# Patient Record
Sex: Female | Born: 1935 | Race: White | Hispanic: No | Marital: Married | Smoking: Never smoker
Health system: Southern US, Community
[De-identification: ages and names within clinical notes are randomized; demographics above are authoritative.]

## PROBLEM LIST (undated history)

## (undated) DIAGNOSIS — E7800 Pure hypercholesterolemia, unspecified: Secondary | ICD-10-CM

## (undated) DIAGNOSIS — I10 Essential (primary) hypertension: Secondary | ICD-10-CM

## (undated) HISTORY — PX: PARATHYROIDECTOMY: SHX19

## (undated) HISTORY — PX: APPENDECTOMY: SHX54

## (undated) HISTORY — PX: ABDOMINAL HYSTERECTOMY: SHX81

---

## 2015-06-13 ENCOUNTER — Emergency Department (HOSPITAL_BASED_OUTPATIENT_CLINIC_OR_DEPARTMENT_OTHER): Payer: Medicare HMO

## 2015-06-13 ENCOUNTER — Encounter (HOSPITAL_BASED_OUTPATIENT_CLINIC_OR_DEPARTMENT_OTHER): Payer: Self-pay

## 2015-06-13 ENCOUNTER — Emergency Department (HOSPITAL_BASED_OUTPATIENT_CLINIC_OR_DEPARTMENT_OTHER)
Admission: EM | Admit: 2015-06-13 | Discharge: 2015-06-13 | Disposition: A | Discharge status: Other Acute Inpatient Hospital | Payer: Medicare HMO | Attending: Emergency Medicine | Admitting: Emergency Medicine

## 2015-06-13 DIAGNOSIS — R05 Cough: Secondary | ICD-10-CM | POA: Diagnosis present

## 2015-06-13 DIAGNOSIS — E7800 Pure hypercholesterolemia, unspecified: Secondary | ICD-10-CM | POA: Insufficient documentation

## 2015-06-13 DIAGNOSIS — Z79899 Other long term (current) drug therapy: Secondary | ICD-10-CM | POA: Diagnosis not present

## 2015-06-13 DIAGNOSIS — I10 Essential (primary) hypertension: Secondary | ICD-10-CM | POA: Insufficient documentation

## 2015-06-13 DIAGNOSIS — J189 Pneumonia, unspecified organism: Secondary | ICD-10-CM | POA: Diagnosis not present

## 2015-06-13 HISTORY — DX: Essential (primary) hypertension: I10

## 2015-06-13 HISTORY — DX: Pure hypercholesterolemia, unspecified: E78.00

## 2015-06-13 LAB — URINALYSIS, ROUTINE W REFLEX MICROSCOPIC
BILIRUBIN URINE: NEGATIVE
GLUCOSE, UA: NEGATIVE mg/dL
HGB URINE DIPSTICK: NEGATIVE
KETONES UR: 15 mg/dL — AB
NITRITE: NEGATIVE
PH: 8 (ref 5.0–8.0)
Protein, ur: 30 mg/dL — AB
Specific Gravity, Urine: 1.019 (ref 1.005–1.030)

## 2015-06-13 LAB — CBC WITH DIFFERENTIAL/PLATELET
BASOS ABS: 0 10*3/uL (ref 0.0–0.1)
Basophils Relative: 0 %
Eosinophils Absolute: 0.1 10*3/uL (ref 0.0–0.7)
Eosinophils Relative: 1 %
HEMATOCRIT: 41.5 % (ref 36.0–46.0)
Hemoglobin: 13.6 g/dL (ref 12.0–15.0)
LYMPHS PCT: 9 %
Lymphs Abs: 1.7 10*3/uL (ref 0.7–4.0)
MCH: 28.7 pg (ref 26.0–34.0)
MCHC: 32.8 g/dL (ref 30.0–36.0)
MCV: 87.6 fL (ref 78.0–100.0)
MONO ABS: 0.9 10*3/uL (ref 0.1–1.0)
Monocytes Relative: 5 %
NEUTROS ABS: 15.8 10*3/uL — AB (ref 1.7–7.7)
Neutrophils Relative %: 85 %
PLATELETS: 253 10*3/uL (ref 150–400)
RBC: 4.74 MIL/uL (ref 3.87–5.11)
RDW: 14.5 % (ref 11.5–15.5)
WBC: 18.5 10*3/uL — AB (ref 4.0–10.5)

## 2015-06-13 LAB — COMPREHENSIVE METABOLIC PANEL
ALBUMIN: 4.4 g/dL (ref 3.5–5.0)
ALT: 13 U/L — ABNORMAL LOW (ref 14–54)
ANION GAP: 10 (ref 5–15)
AST: 19 U/L (ref 15–41)
Alkaline Phosphatase: 58 U/L (ref 38–126)
BILIRUBIN TOTAL: 0.6 mg/dL (ref 0.3–1.2)
BUN: 13 mg/dL (ref 6–20)
CHLORIDE: 104 mmol/L (ref 101–111)
CO2: 24 mmol/L (ref 22–32)
Calcium: 9.1 mg/dL (ref 8.9–10.3)
Creatinine, Ser: 0.79 mg/dL (ref 0.44–1.00)
GFR calc Af Amer: >60 mL/min (ref >60)
GFR calc non Af Amer: >60 mL/min (ref >60)
GLUCOSE: 136 mg/dL — AB (ref 65–99)
POTASSIUM: 3.4 mmol/L — AB (ref 3.5–5.1)
Sodium: 138 mmol/L (ref 135–145)
TOTAL PROTEIN: 8 g/dL (ref 6.5–8.1)

## 2015-06-13 LAB — URINE MICROSCOPIC-ADD ON

## 2015-06-13 LAB — I-STAT CG4 LACTIC ACID, ED: Lactic Acid, Venous: 1.28 mmol/L (ref 0.5–2.0)

## 2015-06-13 MED ORDER — SODIUM CHLORIDE 0.9 % IV BOLUS (SEPSIS)
1000.0000 mL | Freq: Once | INTRAVENOUS | Status: AC
  Administered 2015-06-13: 1000 mL via INTRAVENOUS

## 2015-06-13 MED ORDER — DEXTROSE 5 % IV SOLN
1.0000 g | Freq: Once | INTRAVENOUS | Status: AC
  Administered 2015-06-13: 1 g via INTRAVENOUS
  Filled 2015-06-13: qty 10

## 2015-06-13 MED ORDER — IOPAMIDOL (ISOVUE-300) INJECTION 61%
75.0000 mL | Freq: Once | INTRAVENOUS | Status: AC | PRN
  Administered 2015-06-13: 75 mL via INTRAVENOUS

## 2015-06-13 MED ORDER — ACETAMINOPHEN 325 MG PO TABS
650.0000 mg | ORAL_TABLET | Freq: Once | ORAL | Status: AC
  Administered 2015-06-13: 650 mg via ORAL
  Filled 2015-06-13: qty 2

## 2015-06-13 MED ORDER — AZITHROMYCIN 250 MG PO TABS
500.0000 mg | ORAL_TABLET | Freq: Once | ORAL | Status: AC
  Administered 2015-06-13: 500 mg via ORAL
  Filled 2015-06-13: qty 2

## 2015-06-13 NOTE — ED Provider Notes (Signed)
CSN: [id number]     Arrival date & time 06/13/15  0820 History   First MD Initiated Contact with Patient 06/13/15 [phone]     Chief Complaint  Patient presents with  . Cough    Patient is a 82 y.o. female presenting with cough. The history is provided by the patient. No language interpreter was used.  Cough  Melanie Colon is a 82 y.o. female who presents to the Emergency Department complaining of sore throat and cough.  She was at the beach yesterday with her husband when she began feeling poorly. She awoke with a sore throat, nausea, body aches, cough. Overall her sore throat has improved but she continues to feel poorly with nausea, cough, body aches. She is coughing up yellow and green material. No known sick contacts. No abdominal pain, chest pain, dysuria. Symptoms are moderate and constant in nature.  Past Medical History  Diagnosis Date  . Hypertension   . Hypercholesteremia    Past Surgical History  Procedure Laterality Date  . Abdominal hysterectomy    . Parathyroidectomy    . Appendectomy     No family history on file. Social History  Substance Use Topics  . Smoking status: Never Smoker   . Smokeless tobacco: None  . Alcohol Use: No   OB History    No data available     Review of Systems  Respiratory: Positive for cough.   All other systems reviewed and are negative.     Allergies  Codeine  Home Medications   Prior to Admission medications   Medication Sig Start Date End Date Taking? Authorizing Provider  amLODipine (NORVASC) 10 MG tablet Take 10 mg by mouth daily.   Yes Historical Provider, MD  pravastatin (PRAVACHOL) 20 MG tablet Take 20 mg by mouth daily.   Yes Historical Provider, MD   BP 170/85 mmHg  Pulse 93  Temp(Src) 97.8 F (36.6 C) (Oral)  Resp 22  Ht 5\' 5"  (1.651 m)  Wt 155 lb (70.308 kg)  BMI 25.79 kg/m2  SpO2 94% Physical Exam  Constitutional: She is oriented to person, place, and time. She appears well-developed and well-nourished.   HENT:  Head: Normocephalic and atraumatic.  TMs clear bilaterally. There is mild erythema in the posterior oropharynx without any significant edema. No tonsillar exudate  Cardiovascular: Normal rate and regular rhythm.   No murmur heard. Pulmonary/Chest: Effort normal and breath sounds normal. No stridor. No respiratory distress.  Abdominal: Soft. There is no tenderness. There is no rebound and no guarding.  Musculoskeletal: She exhibits no edema or tenderness.  Neurological: She is alert and oriented to person, place, and time.  Skin: Skin is warm and dry.  Psychiatric: She has a normal mood and affect. Her behavior is normal.  Nursing note and vitals reviewed.   ED Course  Procedures (including critical care time) Labs Review Labs Reviewed  COMPREHENSIVE METABOLIC PANEL - Abnormal; Notable for the following:    Potassium 3.4 (*)    Glucose, Bld 136 (*)    ALT 13 (*)    All other components within normal limits  CBC WITH DIFFERENTIAL/PLATELET - Abnormal; Notable for the following:    WBC 18.5 (*)    Neutro Abs 15.8 (*)    All other components within normal limits  URINALYSIS, ROUTINE W REFLEX MICROSCOPIC (NOT AT [facility]) - Abnormal; Notable for the following:    APPearance CLOUDY (*)    Ketones, ur 15 (*)    Protein, ur 30 (*)  Leukocytes, UA MODERATE (*)    All other components within normal limits  URINE MICROSCOPIC-ADD ON - Abnormal; Notable for the following:    Squamous Epithelial / LPF 0-5 (*)    Bacteria, UA FEW (*)    All other components within normal limits  URINE CULTURE  I-STAT CG4 LACTIC ACID, ED    Imaging Review Dg Chest 2 View  06/13/2015  CLINICAL DATA:  Cough, congestion, sore throat starting yesterday EXAM: CHEST  2 VIEW COMPARISON:  None. FINDINGS: Cardiomediastinal silhouette is unremarkable. Mild thoracic dextroscoliosis. No acute infiltrate or pleural effusion. No pulmonary edema. Thoracic spine osteopenia. IMPRESSION: No active disease. Osteopenia  and mild dextroscoliosis thoracic spine. Electronically Signed   By: Natasha Mead M.D.   On: 06/13/2015 08:59   Ct Soft Tissue Neck W Contrast  06/13/2015  CLINICAL DATA:  Sore throat and fever 1 week EXAM: CT NECK WITH CONTRAST TECHNIQUE: Multidetector CT imaging of the neck was performed using the standard protocol following the bolus administration of intravenous contrast. CONTRAST:  75mL ISOVUE-300 IOPAMIDOL (ISOVUE-300) INJECTION 61% COMPARISON:  None. FINDINGS: Pharynx and larynx: Normal pharynx. Tonsils are normal and symmetric. Normal tongue. Larynx and airway normal. Epiglottis normal in size. Salivary glands: Parotid and submandibular glands normal bilaterally. Thyroid: Thyroid normal in size. Small thyroid nodules. No mass lesion. Lymph nodes: Enlarged submental nodes measuring 13 mm on the right and 14 mm on the left. No other significant adenopathy. Left level 2 node 5 mm. Right level 2 node 6.5 mm. Vascular: Mild atherosclerotic disease of the carotid bifurcation which is patent. Jugular vein patent bilaterally. Limited intracranial: Negative Visualized orbits: Negative Mastoids and visualized paranasal sinuses: Mucosal edema in the maxillary sinus and ethmoid sinus bilaterally. Air-fluid level left maxillary sinus. Mastoid sinus clear bilaterally. Skeleton: Mild cervical degenerative change. No fracture or mass lesion. Upper chest: Lung apices clear. IMPRESSION: Enlarged submental lymph nodes bilaterally. No other adenopathy or mass. These may be reactive nodes due to infection given their symmetry. Negative for tonsillar abscess.  No airway compromise. Electronically Signed   By: Marlan Palau M.D.   On: 06/13/2015 10:33   I have personally reviewed and evaluated these images and lab results as part of my medical decision-making.   EKG Interpretation None      MDM   Final diagnoses:  CAP (community acquired pneumonia)    Patient here for evaluation of fever, sore throat, cough. She  is nontoxic appearing on examination with no respiratory distress. Patient does have some hypoxia in the emergency department with resting O2 sats of 85-88% on room air. With ambulation and her sats decreased to 79%. She endorses feeling lightheaded and weak with activity. Despite her clear lung exam and chest x-ray we will treat for community acquired pneumonia given her constellation of fever, cough, hypoxemia. CT soft tissue neck was obtained to further evaluate her sore throat that was severe in nature yesterday. There is no evidence of epiglottitis or RPA on her imaging. Plan to admit for antibiotics and supplemental oxygen given her hypoxemia. Patient prefers admission to [facility]. Discussed with Dr. Conni Elliot who agrees to admit the patient in transfer.  Tilden Fossa, MD 06/13/15 1120

## 2015-06-13 NOTE — ED Notes (Signed)
Husband back to bedside.

## 2015-06-13 NOTE — ED Notes (Signed)
Placed on 2 liters nasal cannula, O2 sats in RA 85-90%.  MD notified.

## 2015-06-13 NOTE — ED Notes (Signed)
Pt ambulatory to restroom with assist of husband.  MD at bedside to discuss results of testing and pending admission.

## 2015-06-13 NOTE — ED Notes (Signed)
Pt ambulatory to restroom standby assist.

## 2015-06-13 NOTE — ED Notes (Signed)
Patient transported to CT via stretcher per tech. 

## 2015-06-13 NOTE — ED Notes (Signed)
Pt ambulatory to restroom no difficulty.  

## 2015-06-13 NOTE — ED Notes (Signed)
Pt reports started yesterday with cough, congestion and sore throat.

## 2015-06-13 NOTE — ED Notes (Signed)
MD at bedside. 

## 2015-06-13 NOTE — ED Notes (Signed)
Patient transported to X-ray via stretcher per tech. 

## 2015-06-13 NOTE — ED Notes (Signed)
MD at bedside to discuss results of testing. 

## 2015-06-14 LAB — URINE CULTURE

## 2017-09-04 ENCOUNTER — Emergency Department (HOSPITAL_BASED_OUTPATIENT_CLINIC_OR_DEPARTMENT_OTHER): Payer: Medicare HMO

## 2017-09-04 ENCOUNTER — Other Ambulatory Visit: Payer: Self-pay

## 2017-09-04 ENCOUNTER — Encounter (HOSPITAL_BASED_OUTPATIENT_CLINIC_OR_DEPARTMENT_OTHER): Payer: Self-pay | Admitting: Emergency Medicine

## 2017-09-04 ENCOUNTER — Emergency Department (HOSPITAL_BASED_OUTPATIENT_CLINIC_OR_DEPARTMENT_OTHER)
Admission: EM | Admit: 2017-09-04 | Discharge: 2017-09-04 | Disposition: A | Discharge status: Other Acute Inpatient Hospital | Payer: Medicare HMO | Attending: Emergency Medicine | Admitting: Emergency Medicine

## 2017-09-04 DIAGNOSIS — Z79899 Other long term (current) drug therapy: Secondary | ICD-10-CM | POA: Diagnosis not present

## 2017-09-04 DIAGNOSIS — I10 Essential (primary) hypertension: Secondary | ICD-10-CM | POA: Diagnosis not present

## 2017-09-04 DIAGNOSIS — R0902 Hypoxemia: Secondary | ICD-10-CM | POA: Insufficient documentation

## 2017-09-04 DIAGNOSIS — R0602 Shortness of breath: Secondary | ICD-10-CM | POA: Insufficient documentation

## 2017-09-04 DIAGNOSIS — R55 Syncope and collapse: Secondary | ICD-10-CM | POA: Insufficient documentation

## 2017-09-04 DIAGNOSIS — J40 Bronchitis, not specified as acute or chronic: Secondary | ICD-10-CM

## 2017-09-04 DIAGNOSIS — M25561 Pain in right knee: Secondary | ICD-10-CM | POA: Diagnosis present

## 2017-09-04 LAB — CBC WITH DIFFERENTIAL/PLATELET
Basophils Absolute: 0 10*3/uL (ref 0.0–0.1)
Basophils Relative: 0 %
Eosinophils Absolute: 0.1 10*3/uL (ref 0.0–0.7)
Eosinophils Relative: 0 %
HCT: 40.6 % (ref 36.0–46.0)
Hemoglobin: 13.2 g/dL (ref 12.0–15.0)
Lymphocytes Relative: 5 %
Lymphs Abs: 0.9 10*3/uL (ref 0.7–4.0)
MCH: 28.3 pg (ref 26.0–34.0)
MCHC: 32.5 g/dL (ref 30.0–36.0)
MCV: 86.9 fL (ref 78.0–100.0)
Monocytes Absolute: 0.8 10*3/uL (ref 0.1–1.0)
Monocytes Relative: 5 %
Neutro Abs: 15.1 10*3/uL — ABNORMAL HIGH (ref 1.7–7.7)
Neutrophils Relative %: 90 %
Platelets: 220 10*3/uL (ref 150–400)
RBC: 4.67 MIL/uL (ref 3.87–5.11)
RDW: 14.3 % (ref 11.5–15.5)
WBC: 16.8 10*3/uL — ABNORMAL HIGH (ref 4.0–10.5)

## 2017-09-04 LAB — URINALYSIS, MICROSCOPIC (REFLEX)

## 2017-09-04 LAB — COMPREHENSIVE METABOLIC PANEL
ALT: 13 U/L (ref 0–44)
AST: 20 U/L (ref 15–41)
Albumin: 4.3 g/dL (ref 3.5–5.0)
Alkaline Phosphatase: 63 U/L (ref 38–126)
Anion gap: 12 (ref 5–15)
BUN: 14 mg/dL (ref 8–23)
CO2: 26 mmol/L (ref 22–32)
Calcium: 8.6 mg/dL — ABNORMAL LOW (ref 8.9–10.3)
Chloride: 102 mmol/L (ref 98–111)
Creatinine, Ser: 0.78 mg/dL (ref 0.44–1.00)
GFR calc Af Amer: >60 mL/min (ref >60)
GFR calc non Af Amer: >60 mL/min (ref >60)
Glucose, Bld: 111 mg/dL — ABNORMAL HIGH (ref 70–99)
Potassium: 3.1 mmol/L — ABNORMAL LOW (ref 3.5–5.1)
Sodium: 140 mmol/L (ref 135–145)
Total Bilirubin: 0.6 mg/dL (ref 0.3–1.2)
Total Protein: 7.9 g/dL (ref 6.5–8.1)

## 2017-09-04 LAB — TROPONIN I: Troponin I: <0.03 ng/mL (ref <0.03)

## 2017-09-04 LAB — URINALYSIS, ROUTINE W REFLEX MICROSCOPIC
Bilirubin Urine: NEGATIVE
Glucose, UA: NEGATIVE mg/dL
Ketones, ur: 15 mg/dL — AB
Nitrite: NEGATIVE
Protein, ur: NEGATIVE mg/dL
Specific Gravity, Urine: 1.01 (ref 1.005–1.030)
pH: 5.5 (ref 5.0–8.0)

## 2017-09-04 MED ORDER — IOPAMIDOL (ISOVUE-370) INJECTION 76%
100.0000 mL | Freq: Once | INTRAVENOUS | Status: AC | PRN
Start: 2017-09-04 — End: 2017-09-04
  Administered 2017-09-04: 74 mL via INTRAVENOUS

## 2017-09-04 MED ORDER — IPRATROPIUM BROMIDE 0.02 % IN SOLN: 0.5000 mg | Freq: Once | RESPIRATORY_TRACT | Status: DC

## 2017-09-04 MED ORDER — IPRATROPIUM-ALBUTEROL 0.5-2.5 (3) MG/3ML IN SOLN
3.0000 mL | Freq: Once | RESPIRATORY_TRACT | Status: AC
  Administered 2017-09-04: 3 mL via RESPIRATORY_TRACT
  Filled 2017-09-04: qty 3

## 2017-09-04 MED ORDER — METHYLPREDNISOLONE SODIUM SUCC 125 MG IJ SOLR
125.0000 mg | Freq: Once | INTRAMUSCULAR | Status: AC
  Administered 2017-09-04: 125 mg via INTRAVENOUS
  Filled 2017-09-04: qty 2

## 2017-09-04 MED ORDER — ALBUTEROL SULFATE (2.5 MG/3ML) 0.083% IN NEBU
2.5000 mg | INHALATION_SOLUTION | Freq: Once | RESPIRATORY_TRACT | Status: AC
  Administered 2017-09-04: 2.5 mg via RESPIRATORY_TRACT
  Filled 2017-09-04: qty 3

## 2017-09-04 MED ORDER — ONDANSETRON 4 MG PO TBDP
4.0000 mg | ORAL_TABLET | Freq: Once | ORAL | Status: AC | PRN
  Administered 2017-09-04: 4 mg via ORAL
  Filled 2017-09-04: qty 1

## 2017-09-04 MED ORDER — ALBUTEROL SULFATE (2.5 MG/3ML) 0.083% IN NEBU: 5.0000 mg | INHALATION_SOLUTION | Freq: Once | RESPIRATORY_TRACT | Status: DC

## 2017-09-04 NOTE — ED Notes (Signed)
Patient transported to X-ray 

## 2017-09-04 NOTE — ED Notes (Signed)
Patient ambulating in dept with RT

## 2017-09-04 NOTE — ED Notes (Signed)
ED Provider at bedside. 

## 2017-09-04 NOTE — ED Triage Notes (Signed)
Patient states that she fell and hurt her right knee last night  - patient also has nausea

## 2017-09-04 NOTE — ED Notes (Signed)
Dr. Ranae PalmsYelverton made aware of pt's O2 sats 87-92% on room air

## 2017-09-04 NOTE — ED Notes (Signed)
ambulated  On room air.  HR 105-110, RR 20-24, SpO2 88-92%, denies SOB or  Increased DOE.  SpO2 in room on r/a 90%

## 2017-09-04 NOTE — ED Provider Notes (Signed)
  Physical Exam  BP 132/68   Pulse 100   Temp 98.8 F (37.1 C) (Oral)   Resp 18   Ht 5\' 4"  (1.626 m)   Wt 69.9 kg   SpO2 94%   BMI 26.43 kg/m   Physical Exam  ED Course/Procedures     Procedures  MDM  Care assumed at 3:30 pm. Patient had an syncopal event yesterday. Was noted to be hypoxic to 88% on RA. Has been having cough but no fever. WBC 16. CXR clear. Sign out pending CTA and admission.   5 pm Persistently hypoxic to 88% on RA. CTA showed no pneumonia, just bronchitis. Patient wants to go home if possible. Will give steroids, albuterol and ambulate patient.   ,6:35 PM Patient desat to 88% when ambulating despite steroids, neb. I called Dr[facility] Lucianne [facility]Kumar[facility].       Charlynne PanderYao, Melanie Quin Hsienta, MD 09/04/17 [phone]

## 2017-09-04 NOTE — ED Provider Notes (Signed)
[facility] EMERGENCY DEPARTMENT Provider Note   CSN: [id number] Arrival date & time: 09/04/17  1138     History   Chief Complaint Chief Complaint  Patient presents with  . Fall    HPI Melanie Colon is a 84 y.o. female.  HPI Patient states that she fell from standing yesterday evening.  She landed on her right knee.  She is unsure why she fell.  She does not believe that she hit her head.  There was no loss of consciousness.  Since the fall she is complained of fatigue, nausea and ongoing right knee pain.  Worse with palpation.  Patient has been able to ambulate.  Denies chest pain or shortness of breath.  No abdominal pain.  No urinary symptoms. Past Medical History:  Diagnosis Date  . Hypercholesteremia   . Hypertension     There are no active problems to display for this patient.   Past Surgical History:  Procedure Laterality Date  . ABDOMINAL HYSTERECTOMY    . APPENDECTOMY    . PARATHYROIDECTOMY       OB History   None      Home Medications    Prior to Admission medications   Medication Sig Start Date End Date Taking? Authorizing Provider  amLODipine (NORVASC) 10 MG tablet Take 10 mg by mouth daily.   Yes [provider]  estradiol (ESTRACE) 0.5 MG tablet Take 0.5 mg by mouth daily.   Yes [provider]  pravastatin (PRAVACHOL) 20 MG tablet Take 20 mg by mouth daily.   Yes [provider]  vitamin B-12 (CYANOCOBALAMIN) 500 MCG tablet Take 500 mcg by mouth daily.   Yes [provider]    Family History History reviewed. No pertinent family history.  Social History Social History   Tobacco Use  . Smoking status: Never Smoker  . Smokeless tobacco: Never Used  Substance Use Topics  . Alcohol use: No  . Drug use: No     Allergies   Codeine; Gabapentin; and Penicillins   Review of Systems Review of Systems  Constitutional: Positive for fatigue. Negative for chills and fever.  Eyes: Negative for  visual disturbance.  Respiratory: Negative for cough and shortness of breath.   Cardiovascular: Negative for chest pain.  Gastrointestinal: Positive for nausea. Negative for abdominal pain, constipation, diarrhea and vomiting.  Genitourinary: Negative for dysuria, flank pain and frequency.  Musculoskeletal: Positive for arthralgias and joint swelling. Negative for back pain, myalgias and neck pain.  Skin: Negative for rash and wound.  Neurological: Negative for dizziness, syncope, weakness, light-headedness, numbness and headaches.  All other systems reviewed and are negative.    Physical Exam Updated Vital Signs BP (!) 142/81 (BP Location: Right Arm)   Pulse 95   Temp 98.7 F (37.1 C) (Oral)   Resp 16   Ht 5\' 4"  (1.626 m)   Wt 69.9 kg   SpO2 94%   BMI 26.43 kg/m   Physical Exam  Constitutional: She is oriented to person, place, and time. She appears well-developed and well-nourished. No distress.  HENT:  Head: Normocephalic and atraumatic.  Mouth/Throat: Oropharynx is clear and moist.  No obvious head injury.  Midface is stable.  No malocclusion.  No intraoral trauma.  Eyes: Pupils are equal, round, and reactive to light. EOM are normal.  Neck: Normal range of motion. Neck supple.  No posterior midline cervical tenderness to palpation.  Cardiovascular: Normal rate and regular rhythm. Exam reveals no gallop and no friction rub.  No murmur heard. Pulmonary/Chest: Effort normal and breath sounds normal. No stridor. No respiratory distress. She has no wheezes. She has no rales. She exhibits no tenderness.  Abdominal: Soft. Bowel sounds are normal. There is no tenderness. There is no rebound and no guarding.  Musculoskeletal: Normal range of motion. She exhibits tenderness. She exhibits no edema.  Patient with contusion over the right patella.  She has mild to medial tenderness over the right knee.  Full range of motion.  No obvious joint effusions.  No ligamentous instability.   Distal pulses intact.  No lower extremity swelling, asymmetry.  Pelvis is stable.  No midline thoracic or lumbar tenderness.  Neurological: She is alert and oriented to person, place, and time.  5/5 motor all extremities.  Sensation fully intact.  Skin: Skin is warm and dry. Capillary refill takes less than 2 seconds. No rash noted. She is not diaphoretic. No erythema.  Psychiatric: Her behavior is normal.  Flat affect  Nursing note and vitals reviewed.    ED Treatments / Results  Labs (all labs ordered are listed, but only abnormal results are displayed) Labs Reviewed  CBC WITH DIFFERENTIAL/PLATELET - Abnormal; Notable for the following components:      Result Value   WBC 16.8 (*)    Neutro Abs 15.1 (*)    All other components within normal limits  COMPREHENSIVE METABOLIC PANEL - Abnormal; Notable for the following components:   Potassium 3.1 (*)    Glucose, Bld 111 (*)    Calcium 8.6 (*)    All other components within normal limits  URINALYSIS, ROUTINE W REFLEX MICROSCOPIC - Abnormal; Notable for the following components:   Hgb urine dipstick SMALL (*)    Ketones, ur 15 (*)    Leukocytes, UA TRACE (*)    All other components within normal limits  URINALYSIS, MICROSCOPIC (REFLEX) - Abnormal; Notable for the following components:   Bacteria, UA FEW (*)    All other components within normal limits  TROPONIN I    EKG EKG Interpretation  Date/Time:  Saturday September 04 2017 13:59:09 EDT Ventricular Rate:  90 PR Interval:    QRS Duration: 88 QT Interval:  378 QTC Calculation: 463 R Axis:   -25 Text Interpretation:  Sinus rhythm Borderline left axis deviation Abnormal R-wave progression, early transition Nonspecific T abnormalities No old tracing to compare Confirmed by Pricilla LovelessGoldston, Scott [phone]([id number]) on 09/05/2017 3:57:28 PM   Radiology No results found.  Procedures Procedures (including critical care time)  Medications Ordered in ED Medications  ondansetron (ZOFRAN-ODT)  disintegrating tablet 4 mg (4 mg Oral Given 09/04/17 1201)  iopamidol (ISOVUE-370) 76 % injection 100 mL (74 mLs Intravenous Contrast Given 09/04/17 1519)  methylPREDNISolone sodium succinate (SOLU-MEDROL) 125 mg/2 mL injection 125 mg (125 mg Intravenous Given 09/04/17 1649)  albuterol (PROVENTIL) (2.5 MG/3ML) 0.083% nebulizer solution 2.5 mg (2.5 mg Nebulization Given 09/04/17 1634)  ipratropium-albuterol (DUONEB) 0.5-2.5 (3) MG/3ML nebulizer solution 3 mL (3 mLs Nebulization Given 09/04/17 1634)     Initial Impression / Assessment and Plan / ED Course  I have reviewed the triage vital signs and the nursing notes.  Pertinent labs & imaging results that were available during my care of the patient were reviewed by me and considered in my medical decision making (see chart for details).     Patient became hypoxic in the emergency department with saturations in the 80s.  Requiring levels on oxygen to maintain saturations in the 90s.  Chest x-ray and knee x-ray with no  acute findings.  Patient does have elevation in her white blood cell count.  Question pneumonia versus PE.  Will get CT angiogram of the chest.  Signed out to oncoming emergency physician.  Final Clinical Impressions(s) / ED Diagnoses   Final diagnoses:  Hypoxia  Syncope, unspecified syncope type  Bronchitis    ED Discharge Orders    None       Loren RacerYelverton, Jodie Leiner, MD 09/07/17 [phone]

## 2017-09-04 NOTE — ED Notes (Signed)
Patient transported to CT 

## 2017-11-12 ENCOUNTER — Other Ambulatory Visit: Payer: Self-pay

## 2017-11-12 ENCOUNTER — Emergency Department (HOSPITAL_BASED_OUTPATIENT_CLINIC_OR_DEPARTMENT_OTHER): Payer: Medicare HMO

## 2017-11-12 ENCOUNTER — Encounter (HOSPITAL_BASED_OUTPATIENT_CLINIC_OR_DEPARTMENT_OTHER): Payer: Self-pay

## 2017-11-12 ENCOUNTER — Emergency Department (HOSPITAL_BASED_OUTPATIENT_CLINIC_OR_DEPARTMENT_OTHER)
Admission: EM | Admit: 2017-11-12 | Discharge: 2017-11-12 | Disposition: A | Discharge status: Home / Self Care | Payer: Medicare HMO | Attending: Emergency Medicine | Admitting: Emergency Medicine

## 2017-11-12 DIAGNOSIS — Z79899 Other long term (current) drug therapy: Secondary | ICD-10-CM | POA: Insufficient documentation

## 2017-11-12 DIAGNOSIS — R1032 Left lower quadrant pain: Secondary | ICD-10-CM | POA: Diagnosis not present

## 2017-11-12 DIAGNOSIS — I10 Essential (primary) hypertension: Secondary | ICD-10-CM | POA: Insufficient documentation

## 2017-11-12 DIAGNOSIS — R110 Nausea: Secondary | ICD-10-CM | POA: Diagnosis not present

## 2017-11-12 LAB — LIPASE, BLOOD: LIPASE: 34 U/L (ref 11–51)

## 2017-11-12 LAB — COMPREHENSIVE METABOLIC PANEL
ALBUMIN: 4.4 g/dL (ref 3.5–5.0)
ALT: 15 U/L (ref 0–44)
AST: 29 U/L (ref 15–41)
Alkaline Phosphatase: 66 U/L (ref 38–126)
Anion gap: 12 (ref 5–15)
BUN: 13 mg/dL (ref 8–23)
CHLORIDE: 101 mmol/L (ref 98–111)
CO2: 25 mmol/L (ref 22–32)
Calcium: 9.3 mg/dL (ref 8.9–10.3)
Creatinine, Ser: 0.79 mg/dL (ref 0.44–1.00)
GFR calc Af Amer: >60 mL/min (ref >60)
GFR calc non Af Amer: >60 mL/min (ref >60)
GLUCOSE: 113 mg/dL — AB (ref 70–99)
POTASSIUM: 3.5 mmol/L (ref 3.5–5.1)
Sodium: 138 mmol/L (ref 135–145)
Total Bilirubin: 0.3 mg/dL (ref 0.3–1.2)
Total Protein: 7.8 g/dL (ref 6.5–8.1)

## 2017-11-12 LAB — CBC WITH DIFFERENTIAL/PLATELET
ABS IMMATURE GRANULOCYTES: 0.04 10*3/uL (ref 0.00–0.07)
Basophils Absolute: 0 10*3/uL (ref 0.0–0.1)
Basophils Relative: 0 %
Eosinophils Absolute: 0.2 10*3/uL (ref 0.0–0.5)
Eosinophils Relative: 2 %
HCT: 42.5 % (ref 36.0–46.0)
Hemoglobin: 13.5 g/dL (ref 12.0–15.0)
IMMATURE GRANULOCYTES: 0 %
LYMPHS PCT: 19 %
Lymphs Abs: 1.7 10*3/uL (ref 0.7–4.0)
MCH: 28.1 pg (ref 26.0–34.0)
MCHC: 31.8 g/dL (ref 30.0–36.0)
MCV: 88.5 fL (ref 80.0–100.0)
MONOS PCT: 6 %
Monocytes Absolute: 0.5 10*3/uL (ref 0.1–1.0)
NEUTROS ABS: 6.8 10*3/uL (ref 1.7–7.7)
NEUTROS PCT: 73 %
Platelets: 277 10*3/uL (ref 150–400)
RBC: 4.8 MIL/uL (ref 3.87–5.11)
RDW: 13.6 % (ref 11.5–15.5)
WBC: 9.3 10*3/uL (ref 4.0–10.5)
nRBC: 0 % (ref 0.0–0.2)

## 2017-11-12 LAB — URINALYSIS, ROUTINE W REFLEX MICROSCOPIC
Bilirubin Urine: NEGATIVE
GLUCOSE, UA: NEGATIVE mg/dL
Ketones, ur: NEGATIVE mg/dL
Nitrite: NEGATIVE
PH: 7 (ref 5.0–8.0)
Protein, ur: NEGATIVE mg/dL
SPECIFIC GRAVITY, URINE: 1.01 (ref 1.005–1.030)

## 2017-11-12 LAB — URINALYSIS, MICROSCOPIC (REFLEX)

## 2017-11-12 MED ORDER — IOPAMIDOL (ISOVUE-300) INJECTION 61%
100.0000 mL | Freq: Once | INTRAVENOUS | Status: AC | PRN
  Administered 2017-11-12: 100 mL via INTRAVENOUS

## 2017-11-12 NOTE — ED Triage Notes (Signed)
BIB EMS from Home w/ c/o left sided flank pain. Pt denies hx of kidney stones. Pt given IV via EMS in route. Pt arrives A+OX4.

## 2017-11-12 NOTE — ED Provider Notes (Signed)
[facility] EMERGENCY DEPARTMENT Provider Note   CSN: [id number] Arrival date & time: 11/12/17  1522     History   Chief Complaint Chief Complaint  Patient presents with  . Flank Pain    HPI Melanie Colon is a 84 y.o. female presenting for evaluation of left lower quadrant pain.  Patient states that 2 hours prior to arrival, she had acute onset left lower quadrant pain.  The pain was severe and constant.  It radiated to her left low back.  Nothing made the pain better, movement made it worse.  She had associated nausea but no vomiting.  Prior to 2 hours ago, she was having no symptoms.  She denies fevers, chills, chest pain, shortness of breath, urinary symptoms, abnormal bowel movements.  Last BM was today, it was nonbloody.  She denies dysuria, hematuria, urinary frequency.  Patient had a recent dental surgery 2 days ago, but is not taking anything for this including antibiotics or pain medication.  She has had a slightly different diet due to this, including increased carbs and soft foods.  Patient reports a history of digestive dysfunction, in which she has early satiety, but no other abdominal issues.  She has a history of a hysterectomy, but no other abdominal surgeries.  Patient states that several years ago, she had similar pain on the right side which resolved without intervention.  She thinks she might of passed a kidney stone at that time, but she has never had a diagnosis of kidney stones.  She has not taken anything for pain including Tylenol or ibuprofen.  EMS gave 100 mg fentanyl, which significantly improved pt's pain and nausea. No further pain.  HPI  Past Medical History:  Diagnosis Date  . Hypercholesteremia   . Hypertension     There are no active problems to display for this patient.   Past Surgical History:  Procedure Laterality Date  . ABDOMINAL HYSTERECTOMY    . APPENDECTOMY    . PARATHYROIDECTOMY       OB History   None      Home  Medications    Prior to Admission medications   Medication Sig Start Date End Date Taking? Authorizing Provider  amLODipine (NORVASC) 10 MG tablet Take 10 mg by mouth daily.    [provider]  estradiol (ESTRACE) 0.5 MG tablet Take 0.5 mg by mouth daily.    [provider]  pravastatin (PRAVACHOL) 20 MG tablet Take 20 mg by mouth daily.    [provider]  vitamin B-12 (CYANOCOBALAMIN) 500 MCG tablet Take 500 mcg by mouth daily.    [provider]    Family History History reviewed. No pertinent family history.  Social History Social History   Tobacco Use  . Smoking status: Never Smoker  . Smokeless tobacco: Never Used  Substance Use Topics  . Alcohol use: No  . Drug use: No     Allergies   Codeine; Gabapentin; and Penicillins   Review of Systems Review of Systems  Gastrointestinal: Positive for abdominal pain and nausea.  All other systems reviewed and are negative.    Physical Exam Updated Vital Signs BP (!) 148/80 (BP Location: Right Arm)   Pulse 76   Temp 97.7 F (36.5 C) (Oral)   Resp 16   Ht 5\' 3"  (1.6 m)   Wt 68.9 kg   SpO2 96%   BMI 26.93 kg/m   Physical Exam  Constitutional: She is oriented to person, place, and time. She appears  well-developed and well-nourished. No distress.  resting comfortable in NAD  HENT:  Head: Normocephalic and atraumatic.  Eyes: Pupils are equal, round, and reactive to light. Conjunctivae and EOM are normal.  Neck: Normal range of motion. Neck supple.  Cardiovascular: Normal rate, regular rhythm and intact distal pulses.  Pulmonary/Chest: Effort normal and breath sounds normal. No respiratory distress. She has no wheezes.  Abdominal: Soft. She exhibits no distension and no mass. There is tenderness. There is no rebound and no guarding.  TTP of LLQ. No TTP elsewhere in the abd. No rigidity, guarding, or distention. No masses. No rebound.   Musculoskeletal: Normal range of motion.    Neurological: She is alert and oriented to person, place, and time.  Skin: Skin is warm and dry. Capillary refill takes less than 2 seconds.  Psychiatric: She has a normal mood and affect.  Nursing note and vitals reviewed.    ED Treatments / Results  Labs (all labs ordered are listed, but only abnormal results are displayed) Labs Reviewed  COMPREHENSIVE METABOLIC PANEL - Abnormal; Notable for the following components:      Result Value   Glucose, Bld 113 (*)    All other components within normal limits  URINALYSIS, ROUTINE W REFLEX MICROSCOPIC - Abnormal; Notable for the following components:   Color, Urine STRAW (*)    Hgb urine dipstick TRACE (*)    Leukocytes, UA SMALL (*)    All other components within normal limits  URINALYSIS, MICROSCOPIC (REFLEX) - Abnormal; Notable for the following components:   Bacteria, UA FEW (*)    All other components within normal limits  URINE CULTURE  CBC WITH DIFFERENTIAL/PLATELET  LIPASE, BLOOD    EKG None  Radiology Ct Abdomen Pelvis W Contrast  Result Date: 11/12/2017 CLINICAL DATA:  Left-sided flank pain EXAM: CT ABDOMEN AND PELVIS WITH CONTRAST TECHNIQUE: Multidetector CT imaging of the abdomen and pelvis was performed using the standard protocol following bolus administration of intravenous contrast. CONTRAST:  ISOVUE-300 IOPAMIDOL (ISOVUE-300) INJECTION 61% COMPARISON:  None. FINDINGS: LOWER CHEST: There is no basilar pleural or apical pericardial effusion. HEPATOBILIARY: Multiple hepatic cysts measure up to 2.8 cm. Liver is otherwise normal. The gallbladder is normal. PANCREAS: The pancreatic parenchymal contours are normal and there is no ductal dilatation. There is no peripancreatic fluid collection. SPLEEN: Normal. ADRENALS/URINARY TRACT: --Adrenal glands: Normal. --Right kidney/ureter: No hydronephrosis, nephroureterolithiasis, perinephric stranding or solid renal mass. --Left kidney/ureter: No hydronephrosis,  nephroureterolithiasis, perinephric stranding or solid renal mass. --Urinary bladder: Normal for degree of distention STOMACH/BOWEL: --Stomach/Duodenum: There is no hiatal hernia or other gastric abnormality. The duodenal course and caliber are normal. --Small bowel: No dilatation or inflammation. --Colon: No focal abnormality. --Appendix: Surgically absent. VASCULAR/LYMPHATIC: Normal course and caliber of the major abdominal vessels. No abdominal or pelvic lymphadenopathy. REPRODUCTIVE: Status post hysterectomy. No adnexal mass. MUSCULOSKELETAL. No bony spinal canal stenosis or focal osseous abnormality. OTHER: None. IMPRESSION: No acute abnormality of the abdomen or pelvis. Electronically Signed   By: Deatra Robinson M.D.   On: 11/12/2017 17:24    Procedures Procedures (including critical care time)  Medications Ordered in ED Medications  iopamidol (ISOVUE-300) 61 % injection 100 mL (100 mLs Intravenous Contrast Given 11/12/17 1640)     Initial Impression / Assessment and Plan / ED Course  I have reviewed the triage vital signs and the nursing notes.  Pertinent labs & imaging results that were available during my care of the patient were reviewed by me and considered in my  medical decision making (see chart for details).     Pt presenting for evaluation of acute onset left lower quadrant pain approximately 2 hours prior to arrival.  On physical exam, patient appears in no distress.  She is afebrile not tachycardic.  Pain improved with fentanyl given by EMS.  However, mild tenderness palpation of left lower quadrant.  As patient is 84 y/o, consider possible kidney stone versus diverticulitis versus intra-abdominal infection versus Pyelo.  Will obtain labs, urine, and CT for further evaluation.  Labs reassuring, no leukocytosis.  Kidney, liver, pancreatic function reassuring.  UA with small blood, no obvious UTI.  Urine sent.  CT pending.  CT without acute findings.  Case discussed with  attending, Dr. Donnald Garre evaluated the patient.  On reassessment, patient reports remains pain-free.  This is reassuring.  Will p.o. challenge and plan for discharge.  Patient tolerated p.o. without difficulty.  Discussed use of Tylenol as needed for pain.  Follow-up with PCP as needed.  At this time, patient appears safe for discharge.  Strict return precautions given.  Patient states she understands and agrees to plan.   Final Clinical Impressions(s) / ED Diagnoses   Final diagnoses:  LLQ abdominal pain    ED Discharge Orders    None       Alveria Apley, PA-C 11/12/17 1826    Arby Barrette, MD 11/20/17 1456

## 2017-11-12 NOTE — ED Provider Notes (Signed)
Medical screening examination/treatment/procedure(s) were conducted as a shared visit with non-physician practitioner(s) and myself.  I personally evaluated the patient during the encounter.  None Patient has sudden onset of left lower flank pain.  She reports it started just above her iliac crest and then started radiating into her lower back.  She had no associated symptoms.  Pain was constant and very sharp.  She reports that EMS gave her pain medications which resolved the pain.  It has not come back.  Patient is alert and nontoxic.  Clinically well appearance.  No respiratory distress.  Heart is regular.  Abdomen is soft and nontender.  No lower extremity edema.  CT shows no acute findings.  Patient is pain-free and well in appearance.  I agree with plan of management.   Arby Barrette, MD 11/12/17 [phone]

## 2017-11-12 NOTE — Discharge Instructions (Signed)
Take tylenol as needed for pain.  Follow up with your primary care doctor if you have continued discomfort.  Return to the emergency room if you develop high fevers, persistent vomiting, severe constant pain, inability to pass bowel movement/gas, inability to urinate, or any new or concerning symptoms.

## 2017-11-13 LAB — URINE CULTURE

## 2020-07-07 IMAGING — CR DG KNEE COMPLETE 4+V*R*
4 series · 4 of 4 positions shown · non-contrast
Comparison: None.

CLINICAL DATA: Fall onto hands and knees yesterday with right
patellar pain.

EXAM:
RIGHT KNEE - COMPLETE 4+ VIEW

[t knee ap right]
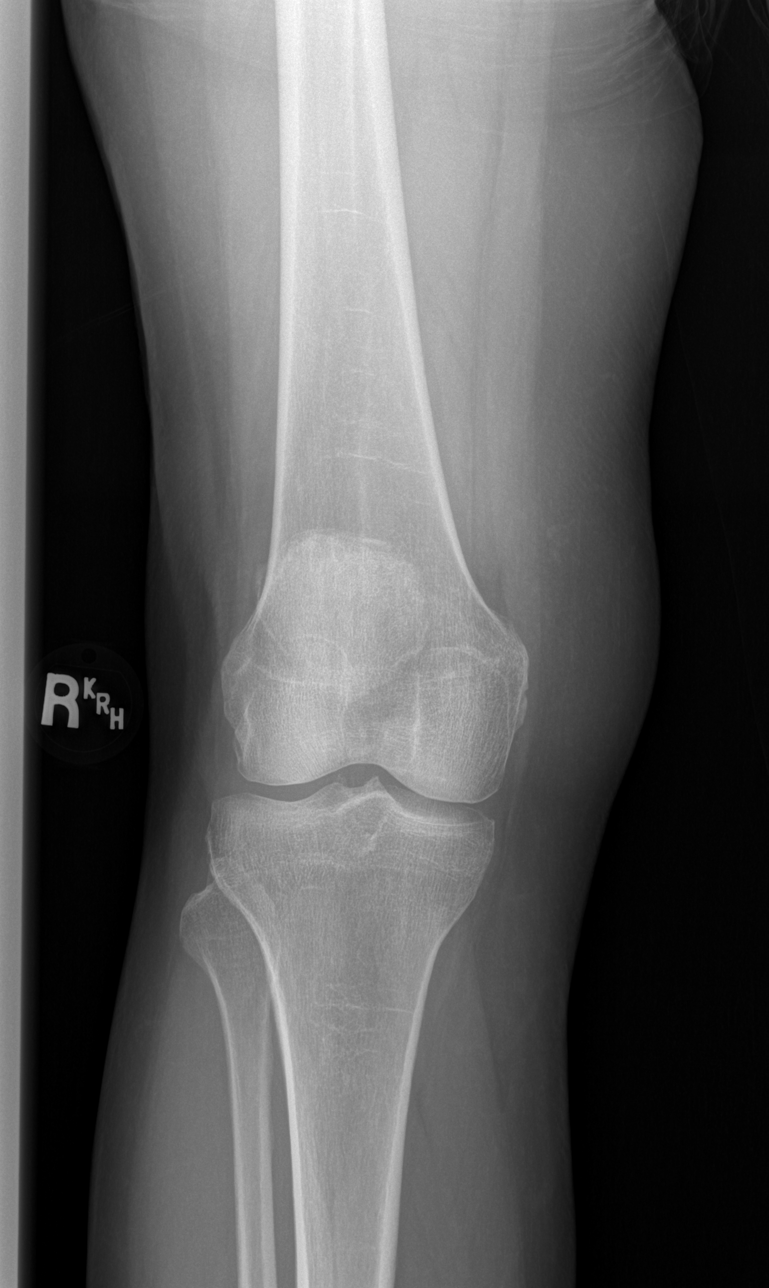

[t knee oblique right (1 of 2)]
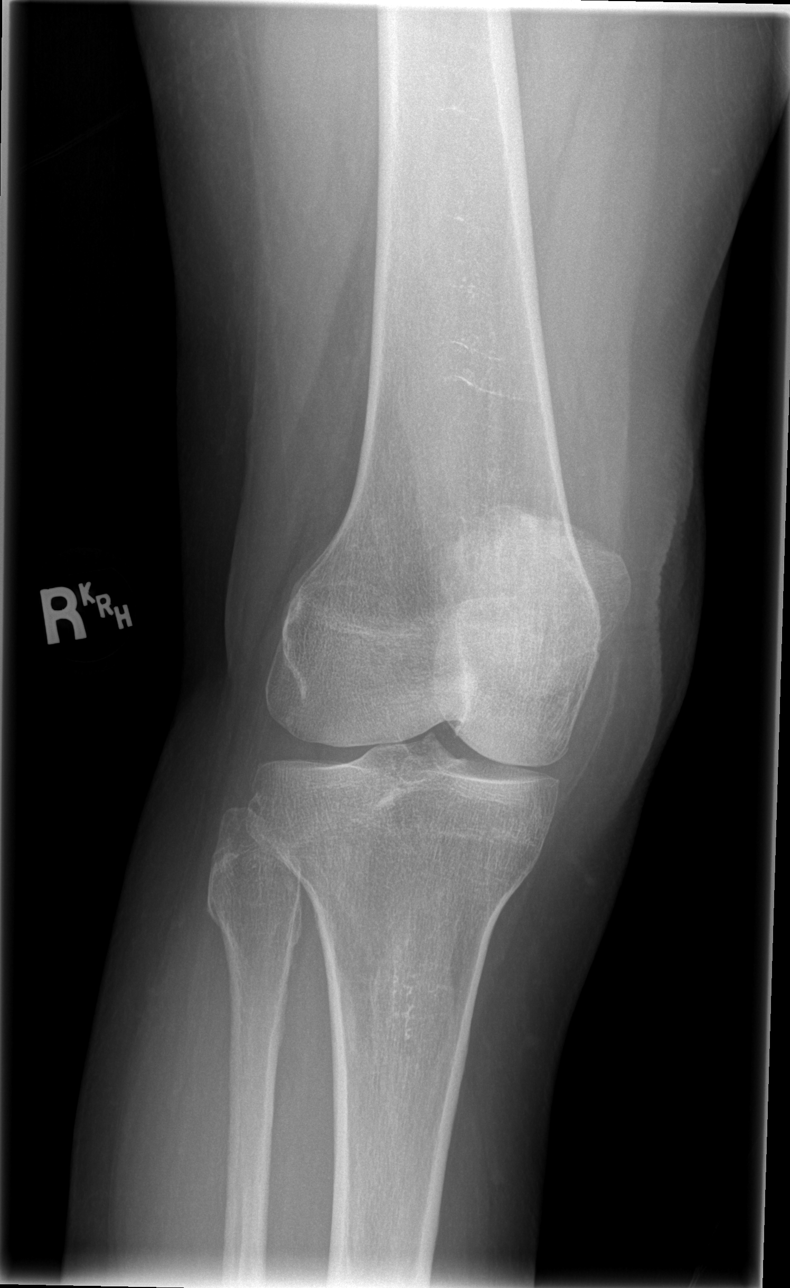

[t knee oblique right (2 of 2)]
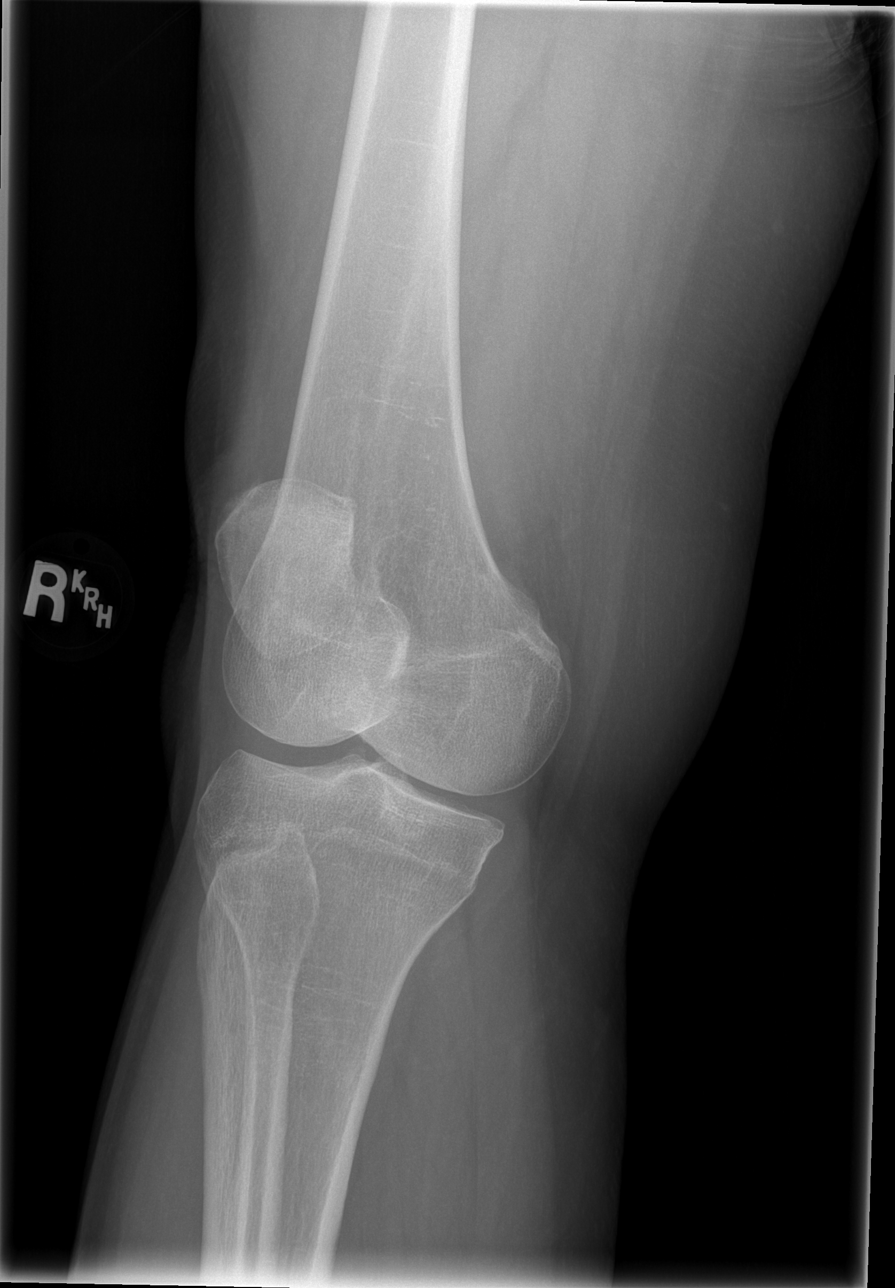

[t knee lat right]
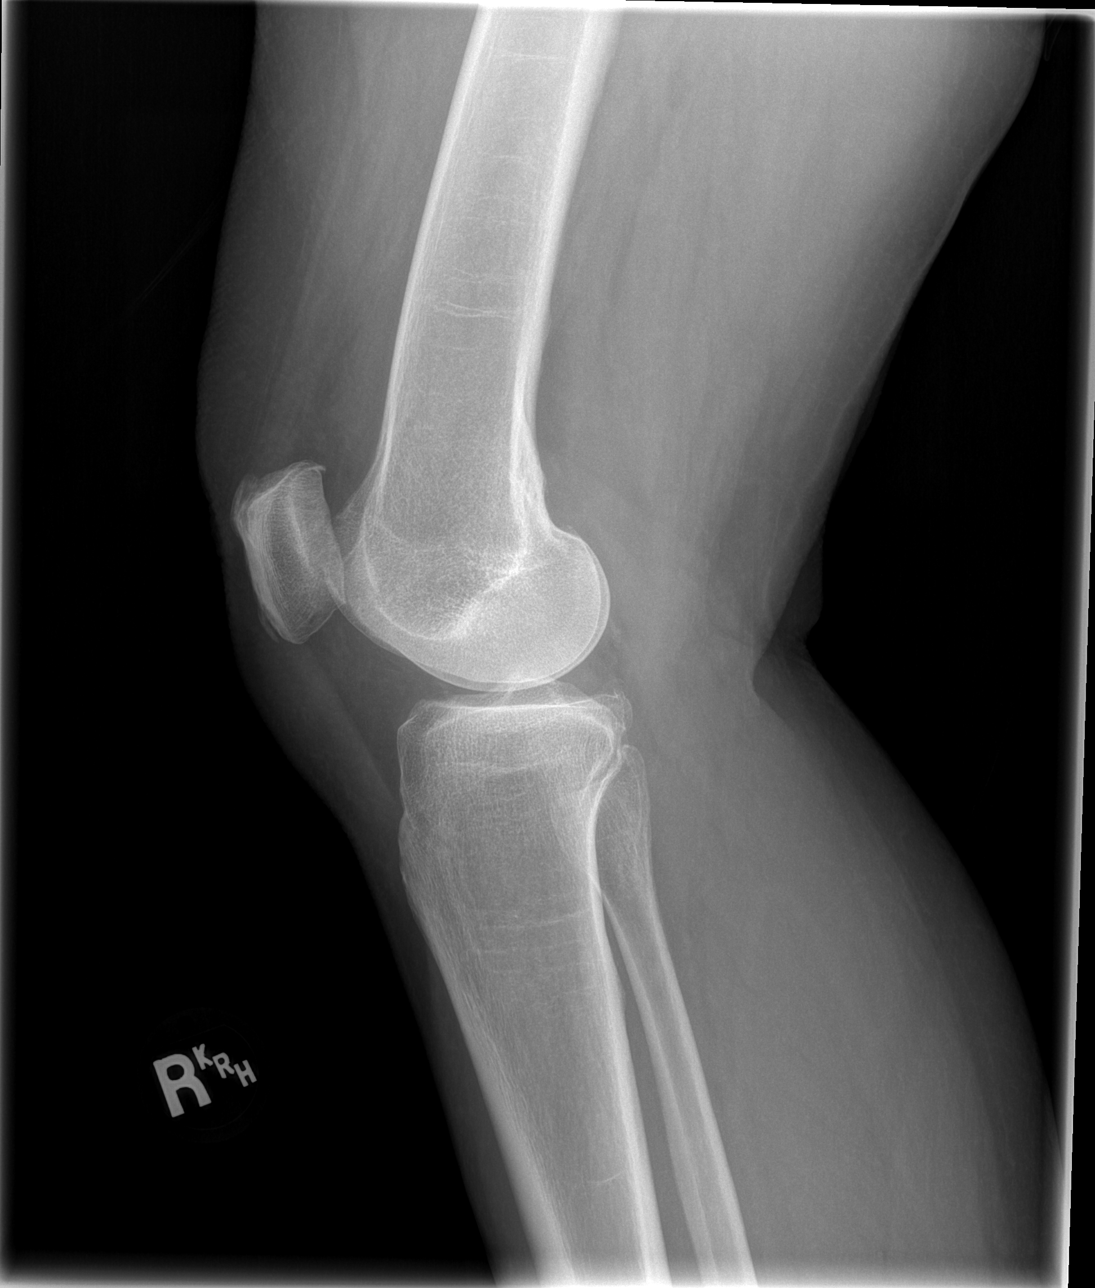

[4 of 4 positions shown; findings below may reference images not displayed]

FINDINGS: Minimal degenerative change of the patellofemoral joint. No evidence
of acute fracture or dislocation. No significant joint effusion.
IMPRESSION: No acute fracture.
